# Patient Record
Sex: Male | Born: 1955 | Race: White | Hispanic: No | Marital: Single | Smoking: Never smoker
Health system: Southern US, Community
[De-identification: ages and names within clinical notes are randomized; demographics above are authoritative.]

## PROBLEM LIST (undated history)

## (undated) DIAGNOSIS — E119 Type 2 diabetes mellitus without complications: Secondary | ICD-10-CM

## (undated) DIAGNOSIS — G4730 Sleep apnea, unspecified: Secondary | ICD-10-CM

## (undated) DIAGNOSIS — E785 Hyperlipidemia, unspecified: Secondary | ICD-10-CM

## (undated) DIAGNOSIS — E781 Pure hyperglyceridemia: Secondary | ICD-10-CM

## (undated) DIAGNOSIS — I10 Essential (primary) hypertension: Secondary | ICD-10-CM

## (undated) HISTORY — PX: TONSILLECTOMY: SUR1361

---

## 1997-12-06 ENCOUNTER — Emergency Department (HOSPITAL_COMMUNITY): Admission: EM | Admit: 1997-12-06 | Discharge: 1997-12-06 | Payer: Self-pay

## 2012-12-01 DIAGNOSIS — I10 Essential (primary) hypertension: Secondary | ICD-10-CM | POA: Insufficient documentation

## 2012-12-01 DIAGNOSIS — E119 Type 2 diabetes mellitus without complications: Secondary | ICD-10-CM | POA: Insufficient documentation

## 2013-07-09 ENCOUNTER — Emergency Department
Admission: EM | Admit: 2013-07-09 | Discharge: 2013-07-09 | Disposition: A | Discharge status: Home / Self Care | Payer: Self-pay | Source: Home / Self Care | Attending: Emergency Medicine | Admitting: Emergency Medicine

## 2013-07-09 ENCOUNTER — Encounter: Payer: Self-pay | Admitting: Emergency Medicine

## 2013-07-09 DIAGNOSIS — J069 Acute upper respiratory infection, unspecified: Secondary | ICD-10-CM

## 2013-07-09 HISTORY — DX: Hyperlipidemia, unspecified: E78.5

## 2013-07-09 HISTORY — DX: Type 2 diabetes mellitus without complications: E11.9

## 2013-07-09 HISTORY — DX: Pure hyperglyceridemia: E78.1

## 2013-07-09 MED ORDER — SULFAMETHOXAZOLE-TRIMETHOPRIM 800-160 MG PO TABS: 1.0000 | ORAL_TABLET | Freq: Two times a day (BID) | ORAL | Status: DC

## 2013-07-09 MED ORDER — ALBUTEROL SULFATE HFA 108 (90 BASE) MCG/ACT IN AERS: 1.0000 | INHALATION_SPRAY | Freq: Four times a day (QID) | RESPIRATORY_TRACT | Status: AC | PRN

## 2013-07-09 NOTE — ED Notes (Signed)
Pt c/o nonproductive cough, sore throat, chest congestion and feels tight and burning in his chest x 1 day. Denies fever.

## 2013-07-09 NOTE — ED Provider Notes (Signed)
CSN: [id number]     Arrival date & time 07/09/13  1609 History   First MD Initiated Contact with Patient 07/09/13 1614     Chief Complaint  Patient presents with  . Cough  . Sore Throat   (Consider location/radiation/quality/duration/timing/severity/associated sxs/prior Treatment) HPI Stuart Shah is a 58 y.o. male who complains of onset of cold symptoms for 1-2 days.  The symptoms are constant and mild-moderate in severity.  He is currently on Amox for ear infection.  Ears feeling worse, cough worsening (tightness and wheeze when coughing).  Has used albuterol in the past but he is out.  He is diabetic and cannot take Prednisone.  Multiple people in his family were sick. + sore throat + cough No pleuritic pain No wheezing + nasal congestion + post-nasal drainage + chest congestion No itchy/red eyes + B earache No hemoptysis No chills/sweats No fever No nausea No vomiting No abdominal pain No diarrhea No skin rashes No fatigue No myalgias No headache     Past Medical History  Diagnosis Date  . Diabetes mellitus without complication   . Hyperlipidemia   . Hypertriglyceridemia    Past Surgical History  Procedure Laterality Date  . Tonsillectomy     Family History  Problem Relation Age of Onset  . Adopted: Yes   History  Substance Use Topics  . Smoking status: Never Smoker   . Smokeless tobacco: Not on file  . Alcohol Use: Yes     Comment: 2 q mth    Review of Systems  All other systems reviewed and are negative.    Allergies  Review of patient's allergies indicates no known allergies.  Home Medications   Current Outpatient Rx  Name  Route  Sig  Dispense  Refill  . atenolol (TENORMIN) 100 MG tablet   Oral   Take 50 mg by mouth daily.         . citalopram (CELEXA) 10 MG tablet   Oral   Take 10 mg by mouth daily.         Marland Kitchen gemfibrozil (LOPID) 600 MG tablet   Oral   Take 600 mg by mouth 2 (two) times daily before a meal.         . glimepiride  (AMARYL) 4 MG tablet   Oral   Take 4 mg by mouth daily with breakfast.         . lisinopril-hydrochlorothiazide (PRINZIDE,ZESTORETIC) 20-25 MG per tablet   Oral   Take 1 tablet by mouth daily.         . metFORMIN (GLUCOPHAGE) 850 MG tablet   Oral   Take 850 mg by mouth 2 (two) times daily with a meal.         . albuterol (PROVENTIL HFA;VENTOLIN HFA) 108 (90 BASE) MCG/ACT inhaler   Inhalation   Inhale 1-2 puffs into the lungs every 6 (six) hours as needed for wheezing or shortness of breath.   1 Inhaler   5   . sulfamethoxazole-trimethoprim (SEPTRA DS) 800-160 MG per tablet   Oral   Take 1 tablet by mouth 2 (two) times daily.   16 tablet   0    BP 152/78  Pulse 101  Temp(Src) 99.1 F (37.3 C) (Oral)  Resp 18  Ht 5' 11.5" (1.816 m)  Wt 290 lb (131.543 kg)  BMI 39.89 kg/m2  SpO2 96% Physical Exam  Nursing note and vitals reviewed. Constitutional: He is oriented to person, place, and time. He appears well-developed and well-nourished.  Non-toxic  appearance. No distress.  HENT:  Head: Normocephalic and atraumatic.  Right Ear: External ear and ear canal normal. Tympanic membrane is erythematous.  Left Ear: External ear normal. A foreign body (ear tube) is present. Tympanic membrane is erythematous.  Nose: Mucosal edema and rhinorrhea present.  Mouth/Throat: Posterior oropharyngeal erythema present. No oropharyngeal exudate or posterior oropharyngeal edema.  Eyes: No scleral icterus.  Neck: Neck supple.  Cardiovascular: Regular rhythm and normal heart sounds.   Pulmonary/Chest: Effort normal and breath sounds normal. No respiratory distress. He has no decreased breath sounds. He has no wheezes. He has no rhonchi.  Neurological: He is alert and oriented to person, place, and time.  Skin: Skin is warm and dry.  Psychiatric: He has a normal mood and affect. His speech is normal.    ED Course  Procedures (including critical care time) Labs Review Labs Reviewed - No  data to display Imaging Review No results found.  EKG Interpretation    Date/Time:    Ventricular Rate:    PR Interval:    QRS Duration:   QT Interval:    QTC Calculation:   R Axis:     Text Interpretation:              MDM   1. Acute upper respiratory infections of unspecified site     1)  Take the prescribed antibiotic as instructed.  Continue Amox, add Bactrim.  Add albuterol.  Probably would benefit from prednisone, but patient states that his sugar becomes too high, so we'll not do that.  No CXR ordered today since lung exam normal. 2)  Use nasal saline solution (over the counter) at least 3 times a day. 3)  Use over the counter decongestants like Zyrtec-D every 12 hours as needed to help with congestion.  If you have hypertension, do not take medicines with sudafed.  4)  Can take tylenol every 6 hours or motrin every 8 hours for pain or fever. 5)  Follow up with your primary doctor if no improvement in 5-7 days, sooner if increasing pain, fever, or new symptoms.     Janeann Forehand, MD 07/09/13 [phone]

## 2015-07-23 ENCOUNTER — Ambulatory Visit: Payer: Self-pay | Admitting: *Deleted

## 2015-08-13 DIAGNOSIS — H6980 Other specified disorders of Eustachian tube, unspecified ear: Secondary | ICD-10-CM | POA: Insufficient documentation

## 2015-08-13 DIAGNOSIS — E7800 Pure hypercholesterolemia, unspecified: Secondary | ICD-10-CM | POA: Insufficient documentation

## 2015-08-13 DIAGNOSIS — F419 Anxiety disorder, unspecified: Secondary | ICD-10-CM | POA: Insufficient documentation

## 2015-08-13 DIAGNOSIS — D126 Benign neoplasm of colon, unspecified: Secondary | ICD-10-CM | POA: Insufficient documentation

## 2015-08-13 DIAGNOSIS — H905 Unspecified sensorineural hearing loss: Secondary | ICD-10-CM | POA: Insufficient documentation

## 2015-08-13 DIAGNOSIS — G4733 Obstructive sleep apnea (adult) (pediatric): Secondary | ICD-10-CM | POA: Insufficient documentation

## 2015-08-13 DIAGNOSIS — K2270 Barrett's esophagus without dysplasia: Secondary | ICD-10-CM | POA: Insufficient documentation

## 2015-08-13 DIAGNOSIS — H6990 Unspecified Eustachian tube disorder, unspecified ear: Secondary | ICD-10-CM | POA: Insufficient documentation

## 2015-08-13 DIAGNOSIS — G2581 Restless legs syndrome: Secondary | ICD-10-CM | POA: Insufficient documentation

## 2015-10-26 ENCOUNTER — Other Ambulatory Visit (HOSPITAL_COMMUNITY): Payer: Self-pay | Admitting: Specialist

## 2015-10-26 ENCOUNTER — Ambulatory Visit (HOSPITAL_COMMUNITY)
Admission: RE | Admit: 2015-10-26 | Discharge: 2015-10-26 | Disposition: A | Discharge status: Home / Self Care | Payer: Self-pay | Source: Ambulatory Visit | Attending: Specialist | Admitting: Specialist

## 2015-10-26 DIAGNOSIS — M79605 Pain in left leg: Secondary | ICD-10-CM | POA: Insufficient documentation

## 2018-08-30 DIAGNOSIS — G4719 Other hypersomnia: Secondary | ICD-10-CM | POA: Insufficient documentation

## 2018-08-30 DIAGNOSIS — E669 Obesity, unspecified: Secondary | ICD-10-CM | POA: Insufficient documentation

## 2018-10-08 DIAGNOSIS — M47817 Spondylosis without myelopathy or radiculopathy, lumbosacral region: Secondary | ICD-10-CM | POA: Insufficient documentation

## 2018-10-08 DIAGNOSIS — M5136 Other intervertebral disc degeneration, lumbar region: Secondary | ICD-10-CM | POA: Insufficient documentation

## 2018-10-08 DIAGNOSIS — M4317 Spondylolisthesis, lumbosacral region: Secondary | ICD-10-CM | POA: Insufficient documentation

## 2019-03-05 ENCOUNTER — Encounter: Payer: Self-pay | Admitting: Family Medicine

## 2019-03-05 ENCOUNTER — Ambulatory Visit (INDEPENDENT_AMBULATORY_CARE_PROVIDER_SITE_OTHER): Payer: Medicare Other | Admitting: Family Medicine

## 2019-03-05 ENCOUNTER — Other Ambulatory Visit: Payer: Self-pay

## 2019-03-05 DIAGNOSIS — G8929 Other chronic pain: Secondary | ICD-10-CM

## 2019-03-05 DIAGNOSIS — M542 Cervicalgia: Secondary | ICD-10-CM | POA: Diagnosis not present

## 2019-03-05 DIAGNOSIS — M545 Low back pain, unspecified: Secondary | ICD-10-CM

## 2019-03-05 MED ORDER — TRAMADOL HCL 50 MG PO TABS: 50.0000 mg | ORAL_TABLET | Freq: Four times a day (QID) | ORAL | 0 refills | Status: DC | PRN

## 2019-03-05 MED ORDER — BACLOFEN 10 MG PO TABS: 5.0000 mg | ORAL_TABLET | Freq: Three times a day (TID) | ORAL | 1 refills | Status: AC | PRN

## 2019-03-05 NOTE — Progress Notes (Signed)
Office Visit Note   Patient: Stuart Shah           Date of Birth: 04/30/1956           MRN: [id number] Visit Date: 03/05/2019 Requested by: No referring provider defined for this encounter. PCP: Patient, No Pcp Per  Subjective: Chief Complaint  Patient presents with  . Lower Back - Pain    Fell 3 months ago - feet went out from under him & he landed on his buttocks, "jamming everything." Worse since this weekend. Pain from Lsp to [facility]sp,with HA & numbness rt hand & down both legs.    HPI: He is here with neck and low back pain.  In April he slipped and fell landing directly on his buttocks.  It was a severe jarring pain.  He was unable to get up for about 30 minutes and he went to see another doctor who took x-rays and diagnosed L5-S1 spondylolisthesis with facet arthritis.  Physical therapy was recommended but not done due to COVID-19.  He did some home stretches, took tramadol for a couple weeks to help him sleep, and eventually his pain became manageable.  Then about 2 weeks ago it started to hurt again and now he cannot sleep at night.  The pain in the low back is in the midline, and he also hurts in the neck.  He was unable to get an appointment with his other doctor so he came here instead.  His daughter was a patient of Dr. Sharol Given in the past.  Patient denies any previous problems with his back before falling.  Denies any bowel or bladder dysfunction.              ROS: No fevers or chills.  All other systems were reviewed and are negative.  Objective: Vital Signs: There were no vitals taken for this visit.  Physical Exam:  General:  Alert and oriented, in no acute distress. Pulm:  Breathing unlabored. Psy:  Normal mood, congruent affect. Skin: No bruising seen. Neck: Limited uppercase, Spurling's test negative.  Tender to palpation to the right of midline at the C5-6 level and also near the C7 spinous process.  Upper extremity strength and reflexes are normal. Low back: Very  tender in the midline over L5-S1.  Slightly tender over the SI joints.  No pain in the sciatic notch.  Negative straight leg raise, lower extremity strength and reflexes are normal.  Imaging: None available for review, none obtained  Assessment & Plan: 1.  Flareup of low back and neck pain 5 months status post fall with history of L5-S1 spondylolisthesis -We will try physical therapy at [facility]. -Trial of baclofen, tramadol as needed. -Contact me for MRI ordering if symptoms do not improve.     Procedures: No procedures performed  No notes on file     PMFS History: Patient Active Problem List   Diagnosis Date Noted  . Degenerative disc disease, lumbar 10/08/2018  . Facet arthritis of lumbosacral region 10/08/2018  . Spondylolisthesis at L5-S1 level 10/08/2018  . Excessive daytime sleepiness 08/30/2018  . Obesity (BMI 30-39.9) 08/30/2018  . Anxiety 08/13/2015  . Barrett's esophagus 08/13/2015  . Benign neoplasm of colon 08/13/2015  . Eustachian tube dysfunction 08/13/2015  . Hypercholesterolemia 08/13/2015  . Obstructive sleep apnea 08/13/2015  . Restless legs syndrome 08/13/2015  . Sensorineural hearing loss 08/13/2015  . Essential hypertension 12/01/2012  . Type 2 or unspecified type diabetes mellitus 12/01/2012   Past Medical History:  Diagnosis Date  . Diabetes mellitus without complication ([place])   . Hyperlipidemia   . Hypertriglyceridemia     Family History  Adopted: Yes    Past Surgical History:  Procedure Laterality Date  . TONSILLECTOMY     Social History   Occupational History  . Not on file  Tobacco Use  . Smoking status: Never Smoker  Substance and Sexual Activity  . Alcohol use: Yes    Comment: 2 q mth  . Drug use: No  . Sexual activity: Not on file

## 2019-05-30 ENCOUNTER — Other Ambulatory Visit: Payer: Self-pay | Admitting: Family Medicine

## 2019-05-30 DIAGNOSIS — G8929 Other chronic pain: Secondary | ICD-10-CM

## 2019-05-30 DIAGNOSIS — M542 Cervicalgia: Secondary | ICD-10-CM

## 2019-05-30 DIAGNOSIS — M545 Low back pain: Secondary | ICD-10-CM

## 2019-05-30 NOTE — Progress Notes (Signed)
I received a message from his physical therapist.  He seems to be struggling to make gains in regards to his neck and low back pain.  We will order x-rays and MRI scans of cervical and lumbar spine and possibly resume physical therapy depending on the results.

## 2019-06-08 ENCOUNTER — Other Ambulatory Visit: Payer: Self-pay

## 2019-06-08 ENCOUNTER — Ambulatory Visit (HOSPITAL_BASED_OUTPATIENT_CLINIC_OR_DEPARTMENT_OTHER)
Admission: RE | Admit: 2019-06-08 | Discharge: 2019-06-08 | Disposition: A | Discharge status: Home / Self Care | Payer: Medicare Other | Source: Ambulatory Visit | Attending: Family Medicine | Admitting: Family Medicine

## 2019-06-08 DIAGNOSIS — M545 Low back pain, unspecified: Secondary | ICD-10-CM

## 2019-06-08 DIAGNOSIS — G8929 Other chronic pain: Secondary | ICD-10-CM | POA: Diagnosis present

## 2019-06-08 DIAGNOSIS — M542 Cervicalgia: Secondary | ICD-10-CM | POA: Diagnosis present

## 2019-06-10 ENCOUNTER — Telehealth: Payer: Self-pay | Admitting: Family Medicine

## 2019-06-10 NOTE — Telephone Encounter (Signed)
Cervical MRI scan is notable for the following:  At the C3-4 level, there is moderate to severe narrowing of the nerve openings due to bone spurring.  There is similar narrowing at the C4-5 level.  At the C6-7 level there is a disc protrusion toward the left causing mild flattening of the spinal cord and moderate narrowing of the left-sided nerve opening.  Lumbar MRI scan is notable for the following:  There is facet joint arthritis at the L5-S1 level along with bulging of the disc which results in moderate narrowing of the spinal canal and nerve openings.  If physical therapy is no longer helping, could contemplate referral for epidural steroid injections in the neck and/or low back.  If symptoms are severe, could contemplate surgical consultation.

## 2019-06-11 NOTE — Telephone Encounter (Signed)
I faxed MRI results to [place].

## 2019-06-11 NOTE — Telephone Encounter (Signed)
I called and advised the patient of the results of his MRIs. I suggested he go into MyChart and look over them again. He is requesting that we send these results to his physical therapist, [place] McAmmonds, so he can discuss this with her as well. He will be back in touch to let us know how he would like to proceed.

## 2021-06-19 ENCOUNTER — Encounter (HOSPITAL_BASED_OUTPATIENT_CLINIC_OR_DEPARTMENT_OTHER): Payer: Self-pay

## 2021-06-19 ENCOUNTER — Other Ambulatory Visit: Payer: Self-pay

## 2021-06-19 ENCOUNTER — Emergency Department (HOSPITAL_BASED_OUTPATIENT_CLINIC_OR_DEPARTMENT_OTHER)
Admission: EM | Admit: 2021-06-19 | Discharge: 2021-06-19 | Disposition: A | Discharge status: Home / Self Care | Payer: Medicare HMO | Attending: Emergency Medicine | Admitting: Emergency Medicine

## 2021-06-19 ENCOUNTER — Emergency Department (HOSPITAL_BASED_OUTPATIENT_CLINIC_OR_DEPARTMENT_OTHER): Payer: Medicare HMO

## 2021-06-19 DIAGNOSIS — Z79899 Other long term (current) drug therapy: Secondary | ICD-10-CM | POA: Diagnosis not present

## 2021-06-19 DIAGNOSIS — N200 Calculus of kidney: Secondary | ICD-10-CM | POA: Insufficient documentation

## 2021-06-19 DIAGNOSIS — R109 Unspecified abdominal pain: Secondary | ICD-10-CM | POA: Diagnosis present

## 2021-06-19 DIAGNOSIS — E119 Type 2 diabetes mellitus without complications: Secondary | ICD-10-CM | POA: Diagnosis not present

## 2021-06-19 DIAGNOSIS — I10 Essential (primary) hypertension: Secondary | ICD-10-CM | POA: Diagnosis not present

## 2021-06-19 DIAGNOSIS — Z794 Long term (current) use of insulin: Secondary | ICD-10-CM | POA: Diagnosis not present

## 2021-06-19 DIAGNOSIS — Z7984 Long term (current) use of oral hypoglycemic drugs: Secondary | ICD-10-CM | POA: Insufficient documentation

## 2021-06-19 HISTORY — DX: Sleep apnea, unspecified: G47.30

## 2021-06-19 HISTORY — DX: Essential (primary) hypertension: I10

## 2021-06-19 LAB — BASIC METABOLIC PANEL
Anion gap: 9 (ref 5–15)
BUN: 21 mg/dL (ref 8–23)
CO2: 24 mmol/L (ref 22–32)
Calcium: 9 mg/dL (ref 8.9–10.3)
Chloride: 103 mmol/L (ref 98–111)
Creatinine, Ser: 1.49 mg/dL — ABNORMAL HIGH (ref 0.61–1.24)
GFR, Estimated: 51 mL/min — ABNORMAL LOW (ref >60)
Glucose, Bld: 161 mg/dL — ABNORMAL HIGH (ref 70–99)
Potassium: 4.3 mmol/L (ref 3.5–5.1)
Sodium: 136 mmol/L (ref 135–145)

## 2021-06-19 LAB — URINALYSIS, MICROSCOPIC (REFLEX)

## 2021-06-19 LAB — CBC
HCT: 42.4 % (ref 39.0–52.0)
Hemoglobin: 13.9 g/dL (ref 13.0–17.0)
MCH: 30 pg (ref 26.0–34.0)
MCHC: 32.8 g/dL (ref 30.0–36.0)
MCV: 91.6 fL (ref 80.0–100.0)
Platelets: 258 10*3/uL (ref 150–400)
RBC: 4.63 MIL/uL (ref 4.22–5.81)
RDW: 12.5 % (ref 11.5–15.5)
WBC: 8.8 10*3/uL (ref 4.0–10.5)
nRBC: 0 % (ref 0.0–0.2)

## 2021-06-19 LAB — URINALYSIS, ROUTINE W REFLEX MICROSCOPIC
Bilirubin Urine: NEGATIVE
Glucose, UA: NEGATIVE mg/dL
Ketones, ur: NEGATIVE mg/dL
Leukocytes,Ua: NEGATIVE
Nitrite: NEGATIVE
Protein, ur: NEGATIVE mg/dL
Specific Gravity, Urine: 1.025 (ref 1.005–1.030)
pH: 5 (ref 5.0–8.0)

## 2021-06-19 MED ORDER — SODIUM CHLORIDE 0.9 % IV BOLUS
1000.0000 mL | Freq: Once | INTRAVENOUS | Status: AC
  Administered 2021-06-19: 12:00:00 1000 mL via INTRAVENOUS

## 2021-06-19 MED ORDER — HYDROCODONE-ACETAMINOPHEN 5-325 MG PO TABS: 1.0000 | ORAL_TABLET | Freq: Four times a day (QID) | ORAL | 0 refills | Status: AC | PRN

## 2021-06-19 MED ORDER — TAMSULOSIN HCL 0.4 MG PO CAPS: 0.4000 mg | ORAL_CAPSULE | Freq: Every day | ORAL | 0 refills | Status: DC

## 2021-06-19 MED ORDER — ONDANSETRON 4 MG PO TBDP: 4.0000 mg | ORAL_TABLET | Freq: Three times a day (TID) | ORAL | 0 refills | Status: AC | PRN

## 2021-06-19 MED ORDER — KETOROLAC TROMETHAMINE 15 MG/ML IJ SOLN
15.0000 mg | Freq: Once | INTRAMUSCULAR | Status: AC
  Administered 2021-06-19: 12:00:00 15 mg via INTRAVENOUS
  Filled 2021-06-19: qty 1

## 2021-06-19 MED ORDER — ONDANSETRON HCL 4 MG/2ML IJ SOLN
4.0000 mg | Freq: Once | INTRAMUSCULAR | Status: AC
  Administered 2021-06-19: 12:00:00 4 mg via INTRAVENOUS
  Filled 2021-06-19: qty 2

## 2021-06-19 NOTE — ED Provider Notes (Signed)
[facility] EMERGENCY DEPARTMENT Provider Note   CSN: [id number] Arrival date & time: 06/19/21  1120     History Chief Complaint  Patient presents with   Flank Pain    Stuart Shah is a 65 y.o. male.  65 year old male presents today for evaluation of left-sided flank pain onset 830 this morning while patient was at work.  He endorses associated nausea but has not vomited.  He reports his pain has been intermittent since.  He denies dysuria, hematuria, abdominal pain, fever, chills.  Reports less than adequate hydration and feels he is dehydrated.  He does endorse history of kidney stones most recently 1 year ago.  Denies other complaints.  The history is provided by the patient. No language interpreter was used.      Past Medical History:  Diagnosis Date   Diabetes mellitus without complication ([place])    Hyperlipidemia    Hypertension    Hypertriglyceridemia    Sleep apnea     Patient Active Problem List   Diagnosis Date Noted   Degenerative disc disease, lumbar 10/08/2018   Facet arthritis of lumbosacral region 10/08/2018   Spondylolisthesis at L5-S1 level 10/08/2018   Excessive daytime sleepiness 08/30/2018   Obesity (BMI 30-39.9) 08/30/2018   Anxiety 08/13/2015   Barrett's esophagus 08/13/2015   Benign neoplasm of colon 08/13/2015   Eustachian tube dysfunction 08/13/2015   Hypercholesterolemia 08/13/2015   Obstructive sleep apnea 08/13/2015   Restless legs syndrome 08/13/2015   Sensorineural hearing loss 08/13/2015   Essential hypertension 12/01/2012   Type 2 or unspecified type diabetes mellitus 12/01/2012    Past Surgical History:  Procedure Laterality Date   TONSILLECTOMY         Family History  Adopted: Yes    Social History   Tobacco Use   Smoking status: Never  Substance Use Topics   Alcohol use: Yes    Comment: 2 q mth   Drug use: No    Home Medications Prior to Admission medications   Medication Sig Start Date End Date  Taking? Authorizing Provider  albuterol (PROVENTIL HFA;VENTOLIN HFA) 108 (90 BASE) MCG/ACT inhaler Inhale 1-2 puffs into the lungs every 6 (six) hours as needed for wheezing or shortness of breath. 07/09/13   Janeann Forehand, MD  atenolol (TENORMIN) 100 MG tablet Take 50 mg by mouth daily.    [provider]  atorvastatin (LIPITOR) 40 MG tablet Take 40 mg by mouth every evening. 12/20/18   [provider]  baclofen (LIORESAL) 10 MG tablet Take 0.5-1 tablets (5-10 mg total) by mouth 3 (three) times daily as needed for muscle spasms. 03/05/19   Hilts, Legrand Como, MD  citalopram (CELEXA) 10 MG tablet Take 10 mg by mouth daily.    [provider]  gabapentin (NEURONTIN) 300 MG capsule Take 600 mg by mouth 2 (two) times daily. 02/16/19   [provider]  glimepiride (AMARYL) 4 MG tablet Take 4 mg by mouth daily with breakfast.    [provider]  ibuprofen (ADVIL) 200 MG tablet Take by mouth.    [provider]  Insulin Syringe-Needle U-100 (INSULIN SYRINGE 1CC/31GX5/16") 31G X 5/16" 1 ML MISC Use with insulin 07/24/17   [provider]  LANTUS 100 UNIT/ML injection INJECT [place] DAILY 02/28/19   [provider]  lisinopril-hydrochlorothiazide (PRINZIDE,ZESTORETIC) 20-25 MG per tablet Take 1 tablet by mouth daily.    [provider]  metFORMIN (GLUCOPHAGE) 1000 MG tablet Take 1,000 mg by mouth 2 (  two) times daily with a meal. 01/22/19   [provider]  metoprolol succinate (TOPROL-XL) 25 MG 24 hr tablet Take 25 mg by mouth daily. 01/17/19   [provider]  montelukast (SINGULAIR) 10 MG tablet Take 10 mg by mouth at bedtime. 02/24/19   [provider]  Multiple Vitamin (MULTI-VITAMIN) tablet Take by mouth.    [provider]  pantoprazole (PROTONIX) 40 MG tablet Take 40 mg by mouth daily. 01/16/19   [provider]  traMADol (ULTRAM) 50 MG tablet Take 1 tablet (50 mg  total) by mouth every 6 (six) hours as needed. 03/05/19   Hilts, Legrand Como, MD    Allergies    Patient has no known allergies.  Review of Systems   Review of Systems  Constitutional:  Negative for activity change, chills and fever.  Respiratory:  Negative for cough and shortness of breath.   Gastrointestinal:  Positive for nausea. Negative for abdominal pain and vomiting.  Genitourinary:  Positive for flank pain. Negative for difficulty urinating, dysuria and hematuria.  All other systems reviewed and are negative.  Physical Exam Updated Vital Signs BP (!) 154/72 (BP Location: Right Arm)    Pulse (!) 113    Temp 98 F (36.7 C) (Oral)    Resp 20    Ht 6' (1.829 m)    Wt (!) 144 kg    SpO2 97%    BMI 43.06 kg/m   Physical Exam Vitals and nursing note reviewed.  Constitutional:      General: He is not in acute distress.    Appearance: Normal appearance. He is not ill-appearing.  HENT:     Head: Normocephalic and atraumatic.     Nose: Nose normal.  Eyes:     General: No scleral icterus.    Extraocular Movements: Extraocular movements intact.     Conjunctiva/sclera: Conjunctivae normal.  Cardiovascular:     Rate and Rhythm: Regular rhythm. Tachycardia present.     Pulses: Normal pulses.     Heart sounds: Normal heart sounds.  Pulmonary:     Effort: Pulmonary effort is normal. No respiratory distress.     Breath sounds: Normal breath sounds. No wheezing or rales.  Abdominal:     General: There is no distension.     Palpations: Abdomen is soft.     Tenderness: There is no abdominal tenderness. There is left CVA tenderness. There is no right CVA tenderness.  Musculoskeletal:        General: Normal range of motion.     Cervical back: Normal range of motion.  Skin:    General: Skin is warm and dry.  Neurological:     General: No focal deficit present.     Mental Status: He is alert. Mental status is at baseline.    ED Results / Procedures / Treatments   Labs (all labs ordered  are listed, but only abnormal results are displayed) Labs Reviewed  URINALYSIS, ROUTINE W REFLEX MICROSCOPIC  CBC  BASIC METABOLIC PANEL    EKG None  Radiology No results found.  Procedures Procedures   Medications Ordered in ED Medications  sodium chloride 0.9 % bolus 1,000 mL (has no administration in time range)  ondansetron (ZOFRAN) injection 4 mg (has no administration in time range)  ketorolac (TORADOL) 15 MG/ML injection 15 mg (has no administration in time range)    ED Course  I have reviewed the triage vital signs and the nursing notes.  Pertinent labs & imaging results that were available  during my care of the patient were reviewed by me and considered in my medical decision making (see chart for details).    MDM Rules/Calculators/A&P                         This patient presents to the ED for concern of left flank pain and nausea since 830 this a.m., this involves an extensive number of treatment options, and is a complaint that carries with it a high risk of complications and morbidity.  The differential diagnosis includes pyelonephritis, atypical appendicitis, AAA, other acute intra-abdominal process.    Lab Tests:  I Ordered, reviewed, and interpreted labs.  The pertinent results include: CBC without leukocytosis, or anemia.  BMP with creatinine of 1.49 compared to 1.41 recently on 9/22, UA without signs of UTI.   Imaging Studies ordered:  I ordered imaging studies including CT renal stone study I independently visualized and interpreted imaging which showed 3 mm nonobstructing nephrolithiasis, 2 mm stone near the UV junction.  Without other acute intra-abdominal findings. I agree with the radiologist interpretation   Cardiac Monitoring:  The patient was maintained on a cardiac monitor.  I personally viewed and interpreted the cardiac monitored which showed an underlying rhythm of: Sinus tachycardia   Medicines ordered and prescription drug  management:  I ordered medication including IV hydration, Toradol, Zofran for pain control and hydration Reevaluation of the patient after these medicines showed that the patient improved I have reviewed the patients home medicines and have made adjustments as needed   ED Course:  Patient has been well-appearing in the emergency room received IV hydration, pain medication with some relief.  CT study with finding of 2 mm stone near the UV junction.  Patient's rate improved to about 105 following liter bolus.   Reevaluation:  After the interventions noted above, I reevaluated the patient and found that they have :improved   Dispostion:  After consideration of the diagnostic results and the patients response to treatment feel that the patent would benefit from urology follow-up.  Patient is established with urologist and was unable to get in today due to it being a weekend.  Patient plans to follow-up with his urologist.  Discussed symptomatic treatment with ibuprofen, Zofran, Norco for severe pain and Flomax.  Discussed importance of hydration given his kidney function and follow-up to have this reevaluated.  Patient voices understanding and is in agreement with plan.     Final Clinical Impression(s) / ED Diagnoses Final diagnoses:  Nephrolithiasis    Rx / DC Orders ED Discharge Orders          Ordered    tamsulosin (FLOMAX) 0.4 MG CAPS capsule  Daily        06/19/21 1413    HYDROcodone-acetaminophen (NORCO/VICODIN) 5-325 MG tablet  Every 6 hours PRN        06/19/21 1413    ondansetron (ZOFRAN-ODT) 4 MG disintegrating tablet  Every 8 hours PRN        06/19/21 1413             Evlyn Courier, PA-C 06/19/21 1422    Lennice Sites, DO 06/19/21 1503

## 2021-06-19 NOTE — ED Notes (Signed)
Pt provided with specimen cup but unable to urinate at this time.

## 2021-06-19 NOTE — ED Notes (Signed)
Pt verbalized understanding to pick up prescriptions at pharmacy listed on d/c instructions. Pt states family is picking him up. Ambulated to d/c window with steady gait.

## 2021-06-19 NOTE — Discharge Instructions (Addendum)
Your CT scan today did show a 2 mm stone in your ureter.  Your blood work and urine were otherwise reassuring.  I have sent in Flomax, pain medication, and Zofran to the pharmacy for you.  You can use ibuprofen 400 mg as needed for pain and if that is not sufficient we can use pain medicine.  If you do take the pain medicine can make you drowsy so recommend you do not drive after taking them.  Please continue to drink plenty of fluids.  Your kidney function was elevated today.  Follow-up with your urologist or primary care provider to be reevaluated and have your kidney function reassessed.  If your symptoms worsen please return to the emergency room.

## 2021-06-19 NOTE — ED Triage Notes (Signed)
Pt c/o left sided flank pain starting at 0830 with nausea & diarrhea. Hx of kidney stones, states feels the same.

## 2022-01-14 ENCOUNTER — Encounter (HOSPITAL_BASED_OUTPATIENT_CLINIC_OR_DEPARTMENT_OTHER): Payer: Self-pay

## 2022-01-14 ENCOUNTER — Emergency Department (HOSPITAL_BASED_OUTPATIENT_CLINIC_OR_DEPARTMENT_OTHER)
Admission: EM | Admit: 2022-01-14 | Discharge: 2022-01-14 | Disposition: A | Discharge status: Home / Self Care | Payer: Medicare HMO | Attending: Emergency Medicine | Admitting: Emergency Medicine

## 2022-01-14 ENCOUNTER — Emergency Department (HOSPITAL_BASED_OUTPATIENT_CLINIC_OR_DEPARTMENT_OTHER): Payer: Medicare HMO

## 2022-01-14 ENCOUNTER — Other Ambulatory Visit: Payer: Self-pay

## 2022-01-14 DIAGNOSIS — N201 Calculus of ureter: Secondary | ICD-10-CM | POA: Diagnosis not present

## 2022-01-14 DIAGNOSIS — R7309 Other abnormal glucose: Secondary | ICD-10-CM | POA: Insufficient documentation

## 2022-01-14 DIAGNOSIS — Z794 Long term (current) use of insulin: Secondary | ICD-10-CM | POA: Diagnosis not present

## 2022-01-14 DIAGNOSIS — R109 Unspecified abdominal pain: Secondary | ICD-10-CM | POA: Diagnosis present

## 2022-01-14 DIAGNOSIS — Z79899 Other long term (current) drug therapy: Secondary | ICD-10-CM | POA: Diagnosis not present

## 2022-01-14 DIAGNOSIS — N189 Chronic kidney disease, unspecified: Secondary | ICD-10-CM | POA: Insufficient documentation

## 2022-01-14 DIAGNOSIS — R7989 Other specified abnormal findings of blood chemistry: Secondary | ICD-10-CM | POA: Diagnosis not present

## 2022-01-14 LAB — URINALYSIS, ROUTINE W REFLEX MICROSCOPIC
Bilirubin Urine: NEGATIVE
Glucose, UA: >=500 mg/dL — AB
Ketones, ur: NEGATIVE mg/dL
Leukocytes,Ua: NEGATIVE
Nitrite: NEGATIVE
Protein, ur: NEGATIVE mg/dL
Specific Gravity, Urine: 1.02 (ref 1.005–1.030)
pH: 5 (ref 5.0–8.0)

## 2022-01-14 LAB — CBC
HCT: 43.9 % (ref 39.0–52.0)
Hemoglobin: 14.4 g/dL (ref 13.0–17.0)
MCH: 29.5 pg (ref 26.0–34.0)
MCHC: 32.8 g/dL (ref 30.0–36.0)
MCV: 90 fL (ref 80.0–100.0)
Platelets: 263 10*3/uL (ref 150–400)
RBC: 4.88 MIL/uL (ref 4.22–5.81)
RDW: 13.1 % (ref 11.5–15.5)
WBC: 9.3 10*3/uL (ref 4.0–10.5)
nRBC: 0 % (ref 0.0–0.2)

## 2022-01-14 LAB — BASIC METABOLIC PANEL
Anion gap: 8 (ref 5–15)
BUN: 26 mg/dL — ABNORMAL HIGH (ref 8–23)
CO2: 23 mmol/L (ref 22–32)
Calcium: 9.3 mg/dL (ref 8.9–10.3)
Chloride: 105 mmol/L (ref 98–111)
Creatinine, Ser: 1.52 mg/dL — ABNORMAL HIGH (ref 0.61–1.24)
GFR, Estimated: 50 mL/min — ABNORMAL LOW (ref >60)
Glucose, Bld: 217 mg/dL — ABNORMAL HIGH (ref 70–99)
Potassium: 4 mmol/L (ref 3.5–5.1)
Sodium: 136 mmol/L (ref 135–145)

## 2022-01-14 LAB — URINALYSIS, MICROSCOPIC (REFLEX)

## 2022-01-14 MED ORDER — TAMSULOSIN HCL 0.4 MG PO CAPS: 0.4000 mg | ORAL_CAPSULE | Freq: Every day | ORAL | 0 refills | Status: AC

## 2022-01-14 MED ORDER — OXYCODONE-ACETAMINOPHEN 5-325 MG PO TABS: 1.0000 | ORAL_TABLET | Freq: Four times a day (QID) | ORAL | 0 refills | Status: AC | PRN

## 2022-01-14 MED ORDER — KETOROLAC TROMETHAMINE 30 MG/ML IJ SOLN
15.0000 mg | Freq: Once | INTRAMUSCULAR | Status: AC
  Administered 2022-01-14: 15 mg via INTRAMUSCULAR
  Filled 2022-01-14: qty 1

## 2022-01-14 MED ORDER — ONDANSETRON HCL 4 MG PO TABS: 4.0000 mg | ORAL_TABLET | Freq: Three times a day (TID) | ORAL | 0 refills | Status: AC | PRN

## 2022-01-14 NOTE — ED Provider Notes (Signed)
[facility] Provider Note   CSN: [id number] Arrival date & time: 01/14/22  1132     History  Chief Complaint  Patient presents with   Flank Pain    Stuart Shah is a 66 y.o. male.  He is here with complaint of severe left flank pain that started earlier this morning.  Associated with some nausea.  Diaphoresis.  No dysuria or hematuria.  Feels similar to prior kidney stones.  No vomiting or diarrhea.  Has tried nothing for it.  Has a history of some CKD and so has been off of NSAIDs for a while.  The history is provided by the patient.  Flank Pain This is a recurrent problem. The current episode started 3 to 5 hours ago. The problem occurs constantly. The problem has not changed since onset.Pertinent negatives include no chest pain, no abdominal pain, no headaches and no shortness of breath. Nothing aggravates the symptoms. Nothing relieves the symptoms. He has tried nothing for the symptoms. The treatment provided no relief.       Home Medications Prior to Admission medications   Medication Sig Start Date End Date Taking? Authorizing Provider  albuterol (PROVENTIL HFA;VENTOLIN HFA) 108 (90 BASE) MCG/ACT inhaler Inhale 1-2 puffs into the lungs every 6 (six) hours as needed for wheezing or shortness of breath. 07/09/13   Janeann Forehand, MD  atenolol (TENORMIN) 100 MG tablet Take 50 mg by mouth daily.    [provider]  atorvastatin (LIPITOR) 40 MG tablet Take 40 mg by mouth every evening. 12/20/18   [provider]  baclofen (LIORESAL) 10 MG tablet Take 0.5-1 tablets (5-10 mg total) by mouth 3 (three) times daily as needed for muscle spasms. 03/05/19   Hilts, Legrand Como, MD  citalopram (CELEXA) 10 MG tablet Take 10 mg by mouth daily.    [provider]  gabapentin (NEURONTIN) 300 MG capsule Take 600 mg by mouth 2 (two) times daily. 02/16/19   [provider]  glimepiride (AMARYL) 4 MG tablet Take 4 mg by mouth daily  with breakfast.    [provider]  ibuprofen (ADVIL) 200 MG tablet Take by mouth.    [provider]  Insulin Syringe-Needle U-100 (INSULIN SYRINGE 1CC/31GX5/16") 31G X 5/16" 1 ML MISC Use with insulin 07/24/17   [provider]  LANTUS 100 UNIT/ML injection INJECT [place] DAILY 02/28/19   [provider]  lisinopril-hydrochlorothiazide (PRINZIDE,ZESTORETIC) 20-25 MG per tablet Take 1 tablet by mouth daily.    [provider]  metFORMIN (GLUCOPHAGE) 1000 MG tablet Take 1,000 mg by mouth 2 (two) times daily with a meal. 01/22/19   [provider]  metoprolol succinate (TOPROL-XL) 25 MG 24 hr tablet Take 25 mg by mouth daily. 01/17/19   [provider]  montelukast (SINGULAIR) 10 MG tablet Take 10 mg by mouth at bedtime. 02/24/19   [provider]  Multiple Vitamin (MULTI-VITAMIN) tablet Take by mouth.    [provider]  ondansetron (ZOFRAN-ODT) 4 MG disintegrating tablet Take 1 tablet (4 mg total) by mouth every 8 (eight) hours as needed for nausea or vomiting. 06/19/21   Deatra Canter, Amjad, PA-C  pantoprazole (PROTONIX) 40 MG tablet Take 40 mg by mouth daily. 01/16/19   [provider]  tamsulosin (FLOMAX) 0.4 MG CAPS capsule Take 1 capsule (0.4 mg total) by mouth daily. 06/19/21   Evlyn Courier, PA-C      Allergies    Patient has no known allergies.  Review of Systems   Review of Systems  Constitutional:  Negative for fever.  Respiratory:  Negative for shortness of breath.   Cardiovascular:  Negative for chest pain.  Gastrointestinal:  Positive for nausea. Negative for abdominal pain and vomiting.  Genitourinary:  Positive for flank pain. Negative for dysuria.  Neurological:  Negative for headaches.    Physical Exam Updated Vital Signs BP (!) 158/83 (BP Location: Right Arm)   Pulse 96   Temp 97.9 F (36.6 C) (Oral)   Resp 20   Ht 6' (1.829 m)   Wt (!) 145.2 kg   SpO2 98%   BMI 43.40  kg/m  Physical Exam Vitals and nursing note reviewed.  Constitutional:      General: He is not in acute distress.    Appearance: Normal appearance. He is well-developed.  HENT:     Head: Normocephalic and atraumatic.  Eyes:     Conjunctiva/sclera: Conjunctivae normal.  Cardiovascular:     Rate and Rhythm: Normal rate and regular rhythm.     Heart sounds: No murmur heard. Pulmonary:     Effort: Pulmonary effort is normal. No respiratory distress.     Breath sounds: Normal breath sounds.  Abdominal:     Palpations: Abdomen is soft.     Tenderness: There is no abdominal tenderness. There is no guarding or rebound.  Musculoskeletal:     Cervical back: Neck supple.  Skin:    General: Skin is warm and dry.     Capillary Refill: Capillary refill takes less than 2 seconds.  Neurological:     General: No focal deficit present.     Mental Status: He is alert.     ED Results / Procedures / Treatments   Labs (all labs ordered are listed, but only abnormal results are displayed) Labs Reviewed  URINALYSIS, ROUTINE W REFLEX MICROSCOPIC - Abnormal; Notable for the following components:      Result Value   Glucose, UA >=500 (*)    Hgb urine dipstick LARGE (*)    All other components within normal limits  BASIC METABOLIC PANEL - Abnormal; Notable for the following components:   Glucose, Bld 217 (*)    BUN 26 (*)    Creatinine, Ser 1.52 (*)    GFR, Estimated 50 (*)    All other components within normal limits  URINALYSIS, MICROSCOPIC (REFLEX) - Abnormal; Notable for the following components:   Bacteria, UA FEW (*)    All other components within normal limits  URINE CULTURE  CBC    EKG None  Radiology CT Renal Stone Study  Result Date: 01/14/2022 CLINICAL DATA:  Flank pain EXAM: CT ABDOMEN AND PELVIS WITHOUT CONTRAST TECHNIQUE: Multidetector CT imaging of the abdomen and pelvis was performed following the standard protocol without IV contrast. RADIATION DOSE REDUCTION: This  exam was performed according to the departmental dose-optimization program which includes automated exposure control, adjustment of the mA and/or kV according to patient size and/or use of iterative reconstruction technique. COMPARISON:  06/19/2021 FINDINGS: Lower chest: Unremarkable. Hepatobiliary: No focal abnormalities are seen in the liver. There is no dilation of bile ducts. Gallbladder is unremarkable. Pancreas: No focal abnormalities are seen. Spleen: Unremarkable. Adrenals/Urinary Tract: Adrenals are unremarkable.There is mild left hydronephrosis. There is a 4 mm calculus in the distal left ureter close to the ureterovesical junction. In the previous study, similar calcific density was seen in the midportion of left kidney which is not seen in the left kidney in the current study. There is  left perinephric stranding. There are no demonstrable renal stones. Urinary bladder is not distended. Stomach/Bowel: Small hiatal hernia is seen. Small bowel loops are not dilated. Appendix is not dilated. There is no significant wall thickening in colon. Scattered diverticula are seen without signs of focal diverticulitis. Vascular/Lymphatic: There are scattered arterial calcifications. Reproductive: Unremarkable. Other: There is no ascites or pneumoperitoneum. Small umbilical hernia containing fat is seen. Musculoskeletal: No acute findings are seen. IMPRESSION: There is 4 mm calculus in the distal left ureter close to the ureterovesical junction causing mild left hydronephrosis. There is mild left perinephric stranding which may be due to urinary tract obstruction or superimposed infection. There is no evidence of intestinal obstruction or pneumoperitoneum. Appendix is not dilated. Small hiatal hernia. Scattered diverticula are seen in the colon without signs of focal diverticulitis. Electronically Signed   By: Elmer Picker M.D.   On: 01/14/2022 12:39    Procedures Procedures    Medications Ordered in  ED Medications  ketorolac (TORADOL) 30 MG/ML injection 15 mg (has no administration in time range)    ED Course/ Medical Decision Making/ A&P                           Medical Decision Making Amount and/or Complexity of Data Reviewed Labs: ordered. Radiology: ordered.  Risk Prescription drug management.  This patient complains of left flank pain; this involves an extensive number of treatment Options and is a complaint that carries with it a high risk of complications and morbidity. The differential includes musculoskeletal pain, radiculopathy, renal colic, pyelonephritis  I ordered, reviewed and interpreted labs, which included CBC normal, chemistries with mildly elevated glucose elevated BUN and creatinine similar to priors I ordered medication IM Toradol and reviewed PMP when indicated. I ordered imaging studies which included CT renal and I independently    visualized and interpreted imaging which showed 4 mm distal stone  Previous records obtained and reviewed in epic no recent visits Social determinants considered, no significant barriers Critical Interventions: None  After the interventions stated above, I reevaluated the patient and found patient's pain to be somewhat better Admission and further testing considered, he is comfortable plan for outpatient management this and given contact information for urology.  Prescription sent for pain medication to pharmacy.  Return instructions discussed          Final Clinical Impression(s) / ED Diagnoses Final diagnoses:  Ureterolithiasis    Rx / DC Orders ED Discharge Orders          Ordered    tamsulosin (FLOMAX) 0.4 MG CAPS capsule  Daily after breakfast        01/14/22 1307    oxyCODONE-acetaminophen (PERCOCET/ROXICET) 5-325 MG tablet  Every 6 hours PRN        01/14/22 1307    ondansetron (ZOFRAN) 4 MG tablet  Every 8 hours PRN        01/14/22 1307              Hayden Rasmussen, MD 01/14/22 1727

## 2022-01-14 NOTE — ED Triage Notes (Signed)
Patient c/o left flank pain x this am. Patient states states he has history of kidney stones.

## 2022-01-14 NOTE — Discharge Instructions (Signed)
You are seen in the emergency department for evaluation of left flank pain.  You had some blood in your urine and your CAT scan showed a 4 mm stone in the tube going from the kidney to the bladder.  This size should pass on its own.  Please drink plenty of fluids.  We are prescribing you some medication to help with pain and nausea.  Contact urology for follow-up.  Return to the emergency department if any uncontrolled pain or any fever.

## 2022-01-15 LAB — URINE CULTURE: Culture: <10000 — AB

## 2022-02-14 IMAGING — CT CT RENAL STONE PROTOCOL
2 of 4 series · 17 of 46 positions shown, 19 images · non-contrast
Comparison: None.

CLINICAL DATA: Flank pain.  Kidney stone suspected

EXAM:
CT ABDOMEN AND PELVIS WITHOUT CONTRAST
TECHNIQUE: Multidetector CT imaging of the abdomen and pelvis was performed
following the standard protocol without IV contrast.

[Series 2: axial st · axial · 0.98mm/px · z∈[-480,-15]mm · 14 of 103 slices shown, 16 images]
[im 5/103  soft-tissue]
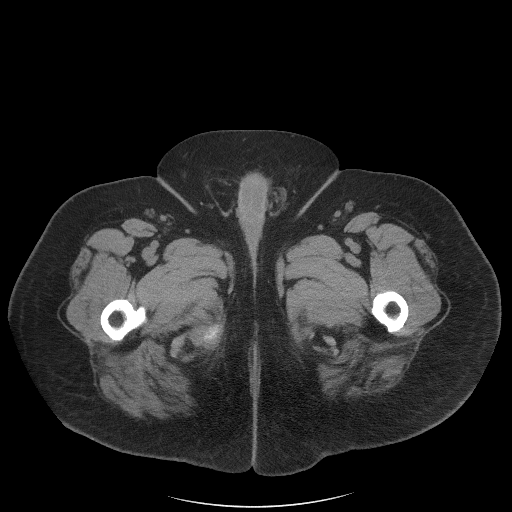
[im 5/103  bone]
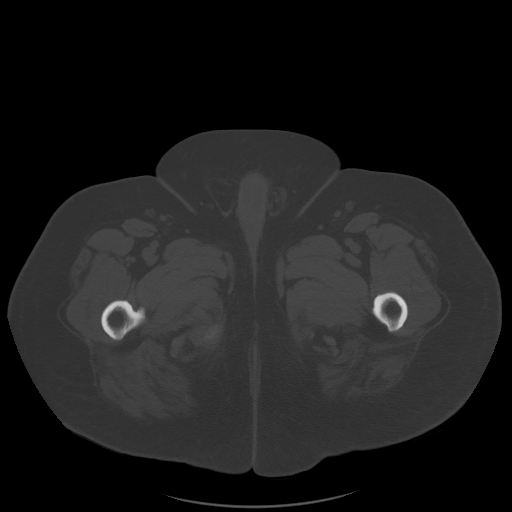
[im 13/103  soft-tissue]
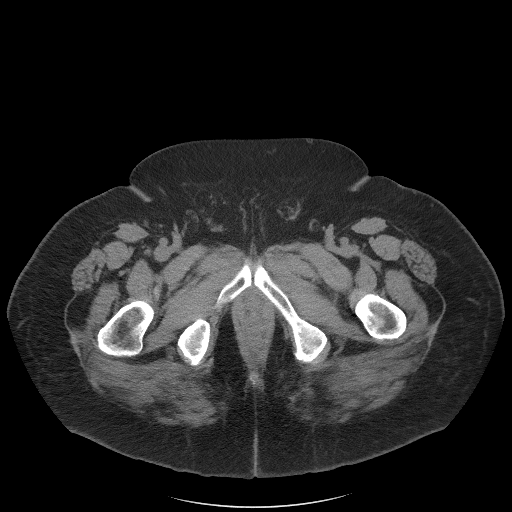
[im 22/103  soft-tissue]
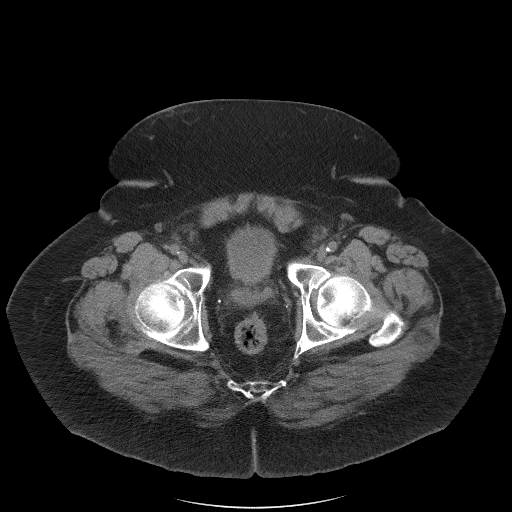
[im 26/103  soft-tissue]
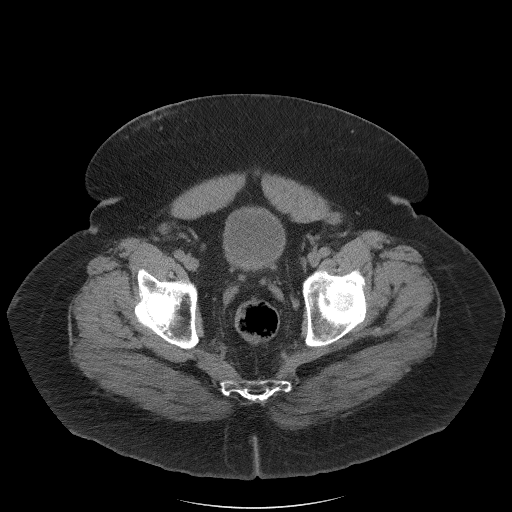
[im 35/103  soft-tissue]
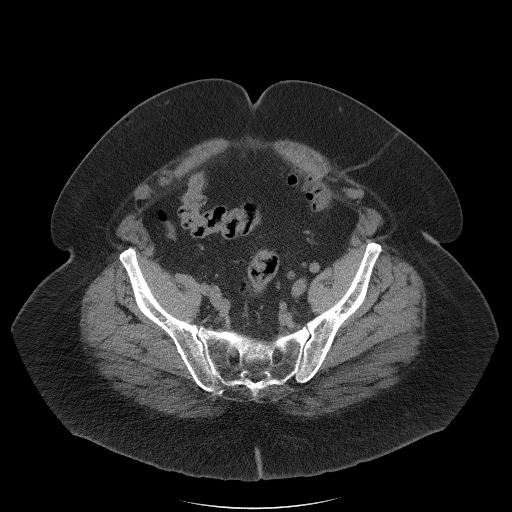
[im 43/103  soft-tissue]
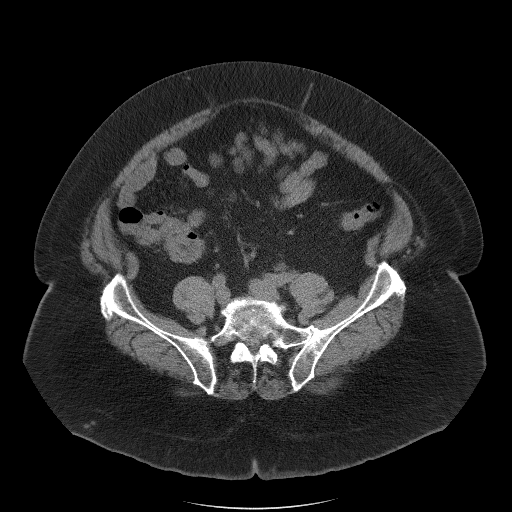
[im 47/103  soft-tissue]
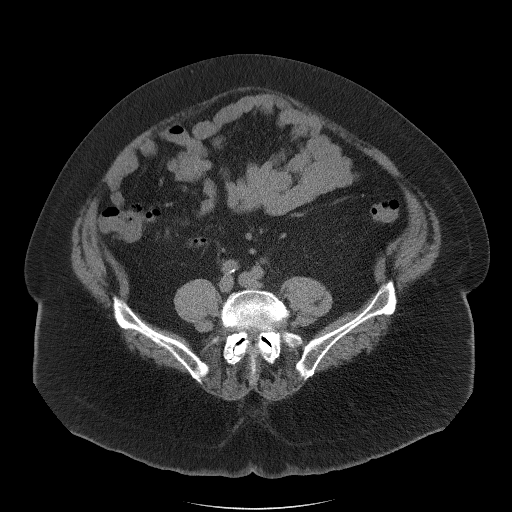
[im 56/103  soft-tissue]
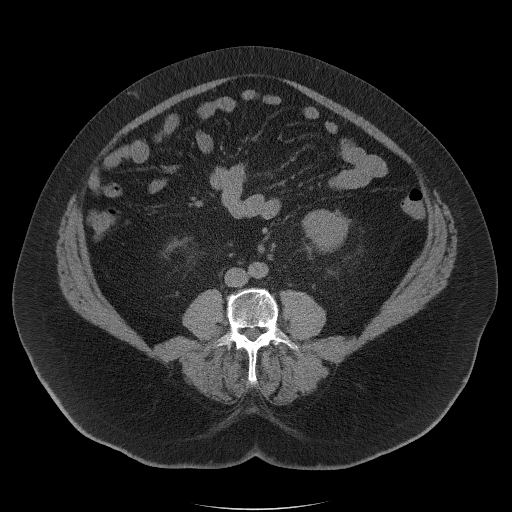
[im 60/103  soft-tissue]
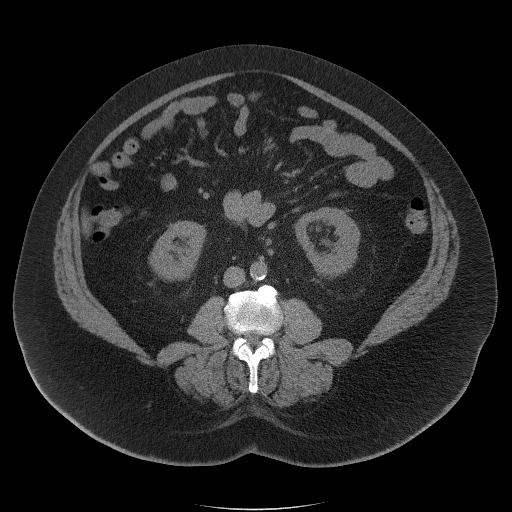
[im 60/103  bone]
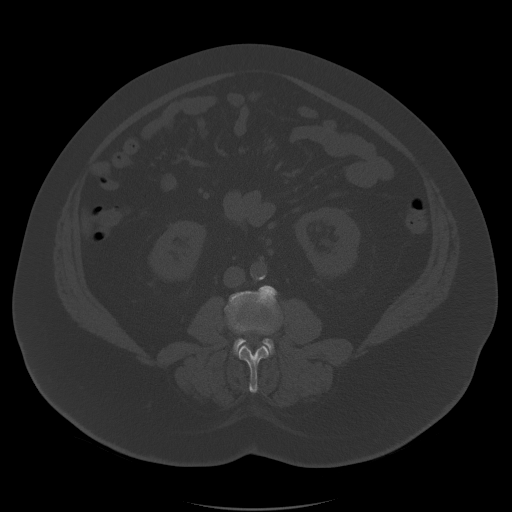
[im 69/103  soft-tissue]
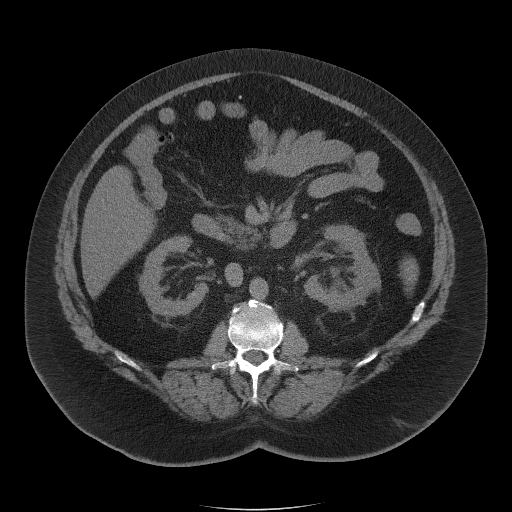
[im 77/103  soft-tissue]
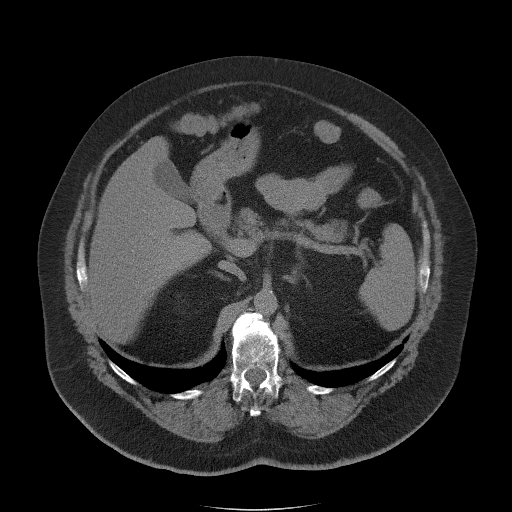
[im 81/103  soft-tissue]
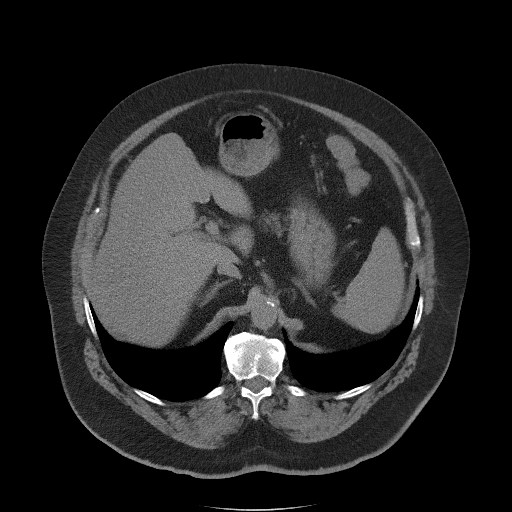
[im 90/103  soft-tissue]
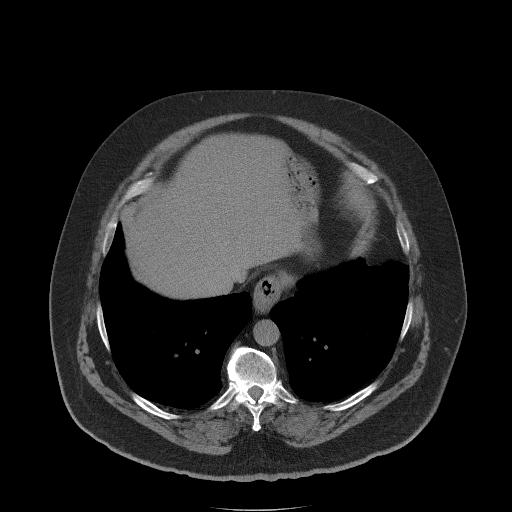
[im 98/103  soft-tissue]
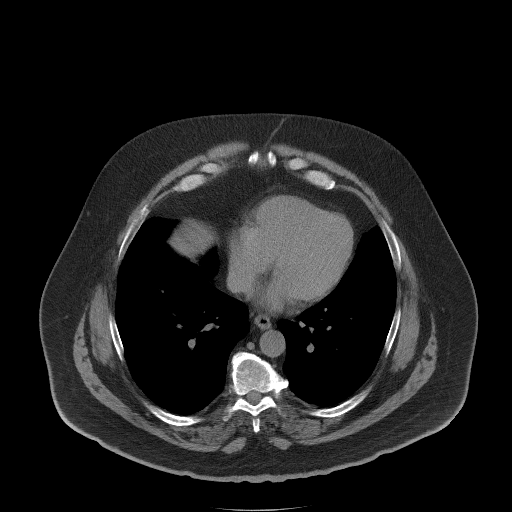

[Series 5: coronal st · coronal · 1.01mm/px · 3 of 153 slices shown]
[im 51/153  soft-tissue]
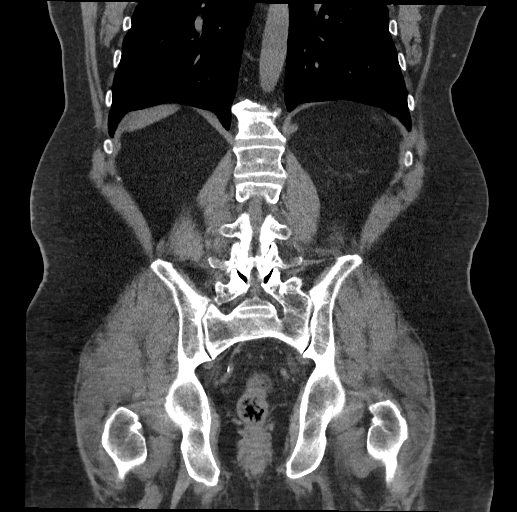
[im 68/153  soft-tissue]
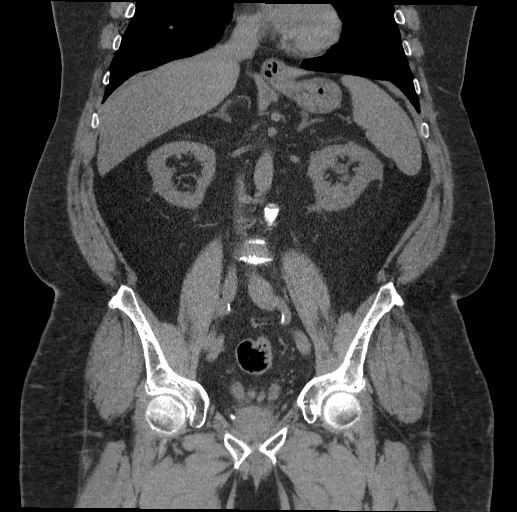
[im 85/153  soft-tissue]
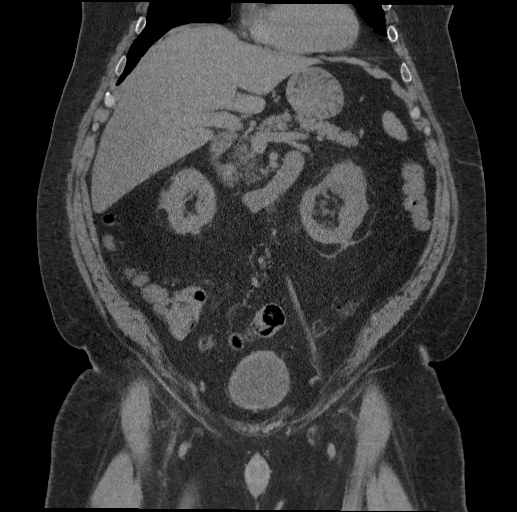

[17 of 46 positions shown; findings below may reference images not displayed]

FINDINGS: Lower chest: Lung bases are clear.

Hepatobiliary: No focal hepatic lesion. No biliary duct dilatation.
Common bile duct is normal.

Pancreas: Pancreas is normal. No ductal dilatation. No pancreatic
inflammation.

Spleen: Normal spleen

Adrenals/urinary tract: Adrenal glands are normal. Nonobstructing
calculus in the lower pole of the LEFT kidney measures 3 mm. No
hydroureter nephrosis or hydroureter. Nonobstructing calculus in the
distal LEFT ureter measures 2 mm (image 76/2). This distal LEFT
ureteral calculus is 1 cm from the LEFT vesicoureteral junction.
Calculus found on image 76/CT series 2.

No bladder calculi

Stomach/Bowel: Stomach, small bowel, appendix, and cecum are normal.
The colon and rectosigmoid colon are normal.

Vascular/Lymphatic: Abdominal aorta is normal caliber. No periportal
or retroperitoneal adenopathy. No pelvic adenopathy.

Reproductive: Prostate unremarkable

Other: No free fluid

Musculoskeletal: No aggressive osseous lesion.
IMPRESSION: 1. Nonobstructing calculus in the distal LEFT ureter 1 cm from the
LEFT vesicoureteral junction.
2. Nonobstructing LEFT renal calculus.
